# Patient Record
Sex: Female | Born: 1975 | Race: Asian | Hispanic: No | Marital: Single
Health system: Southern US, Community
[De-identification: ages and names within clinical notes are randomized; demographics above are authoritative.]

---

## 2003-12-21 ENCOUNTER — Ambulatory Visit (HOSPITAL_COMMUNITY): Admission: RE | Admit: 2003-12-21 | Discharge: 2003-12-21 | Payer: Self-pay | Admitting: Family Medicine

## 2016-09-09 ENCOUNTER — Other Ambulatory Visit: Payer: Self-pay | Admitting: Family

## 2016-09-09 DIAGNOSIS — Z1231 Encounter for screening mammogram for malignant neoplasm of breast: Secondary | ICD-10-CM

## 2016-09-25 ENCOUNTER — Ambulatory Visit
Admission: RE | Admit: 2016-09-25 | Discharge: 2016-09-25 | Disposition: A | Discharge status: Home / Self Care | Payer: No Typology Code available for payment source | Source: Ambulatory Visit | Attending: Family | Admitting: Family

## 2016-09-25 DIAGNOSIS — Z1231 Encounter for screening mammogram for malignant neoplasm of breast: Secondary | ICD-10-CM

## 2017-12-03 ENCOUNTER — Ambulatory Visit: Payer: BLUE CROSS/BLUE SHIELD | Admitting: Neurology

## 2018-05-22 IMAGING — MG DIGITAL SCREENING BILATERAL MAMMOGRAM WITH CAD
4 series · 4 of 4 positions shown · non-contrast
Comparison: None.

CLINICAL DATA: Screening.

EXAM:
DIGITAL SCREENING BILATERAL MAMMOGRAM WITH CAD

[L CC]
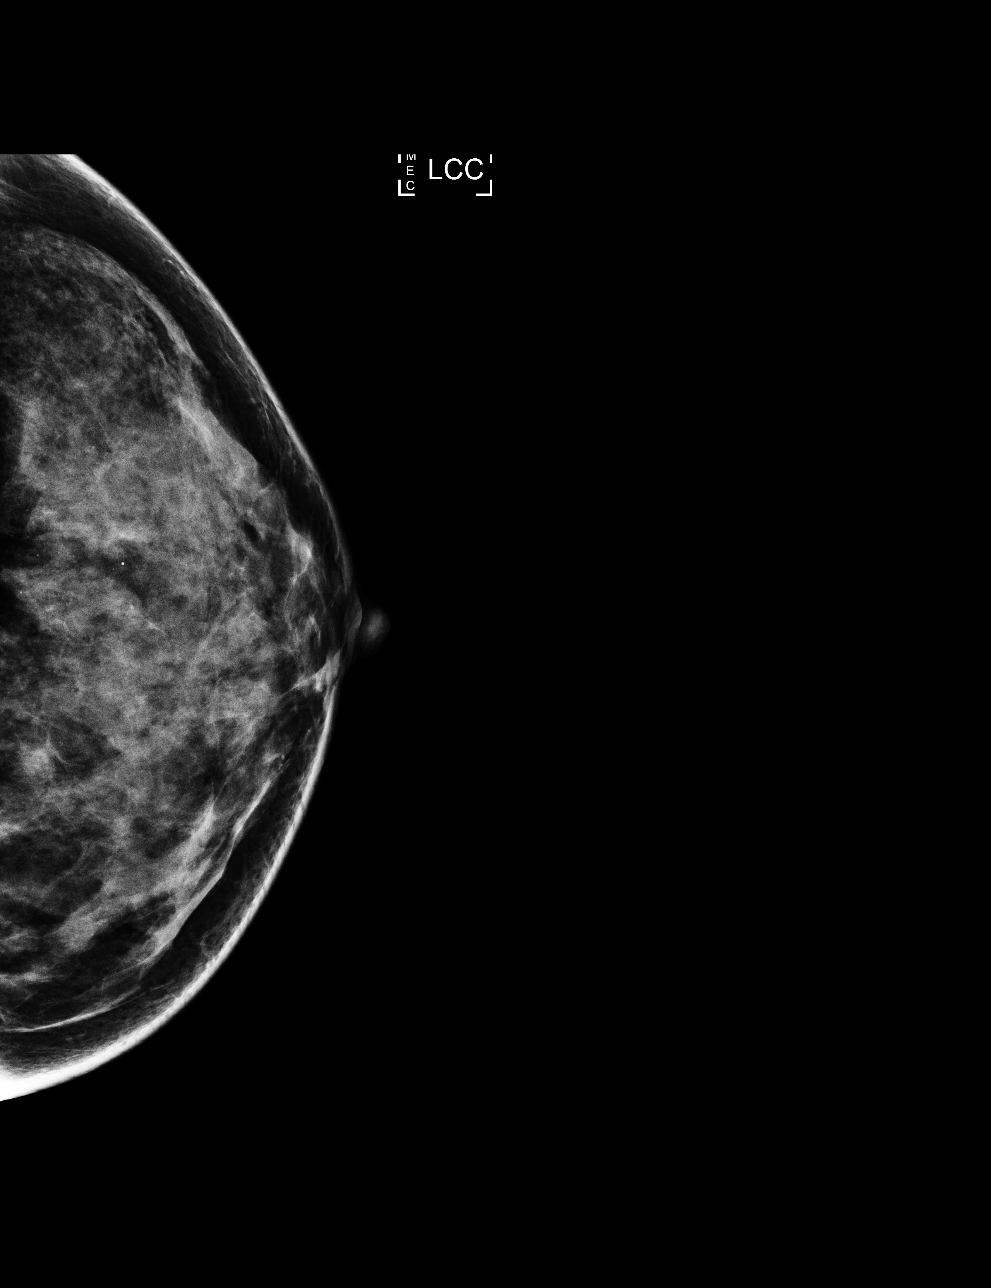

[L MLO]
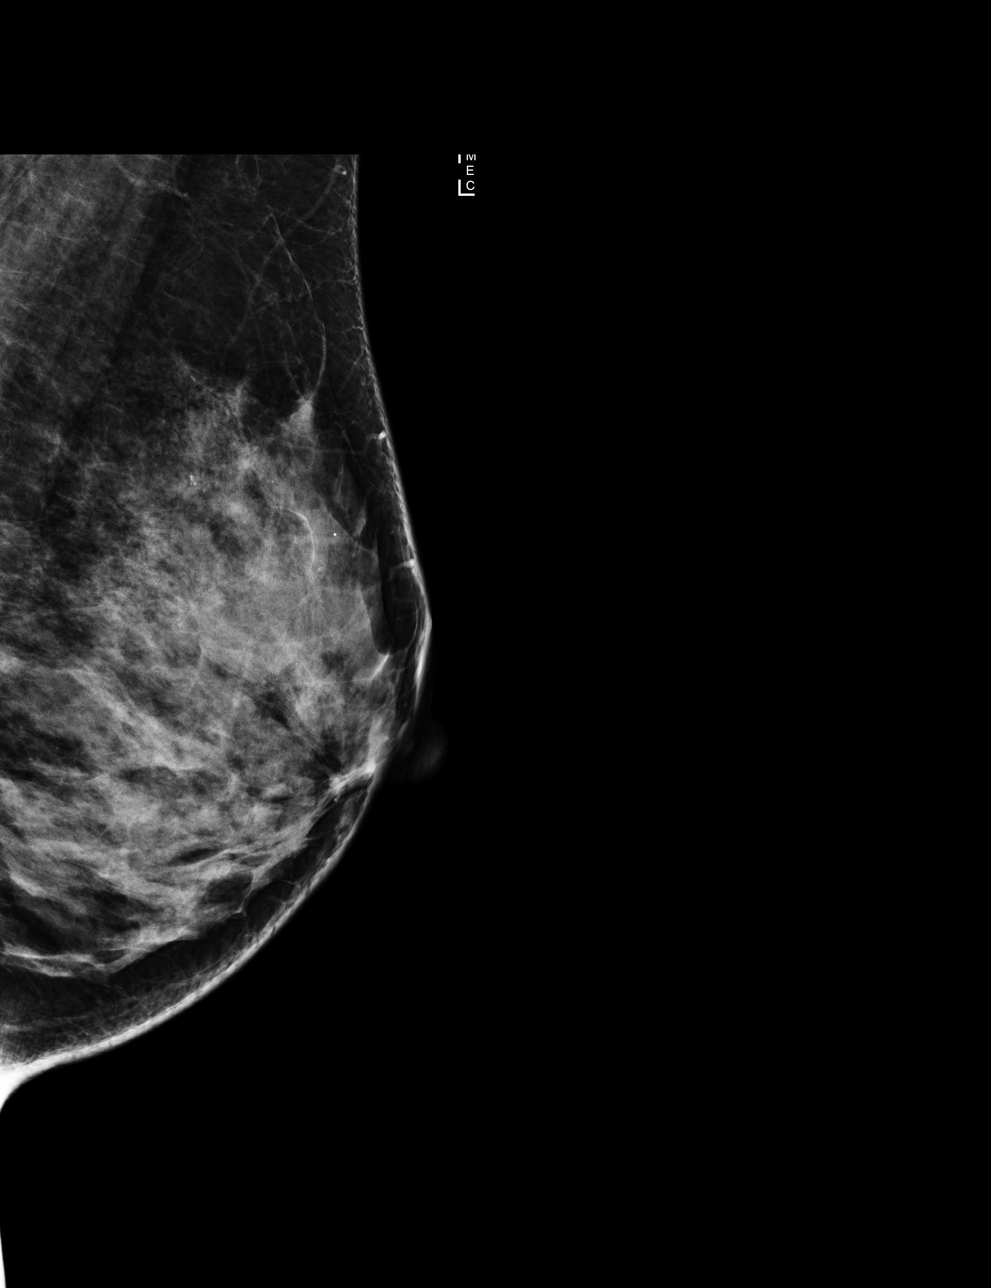

[R CC]
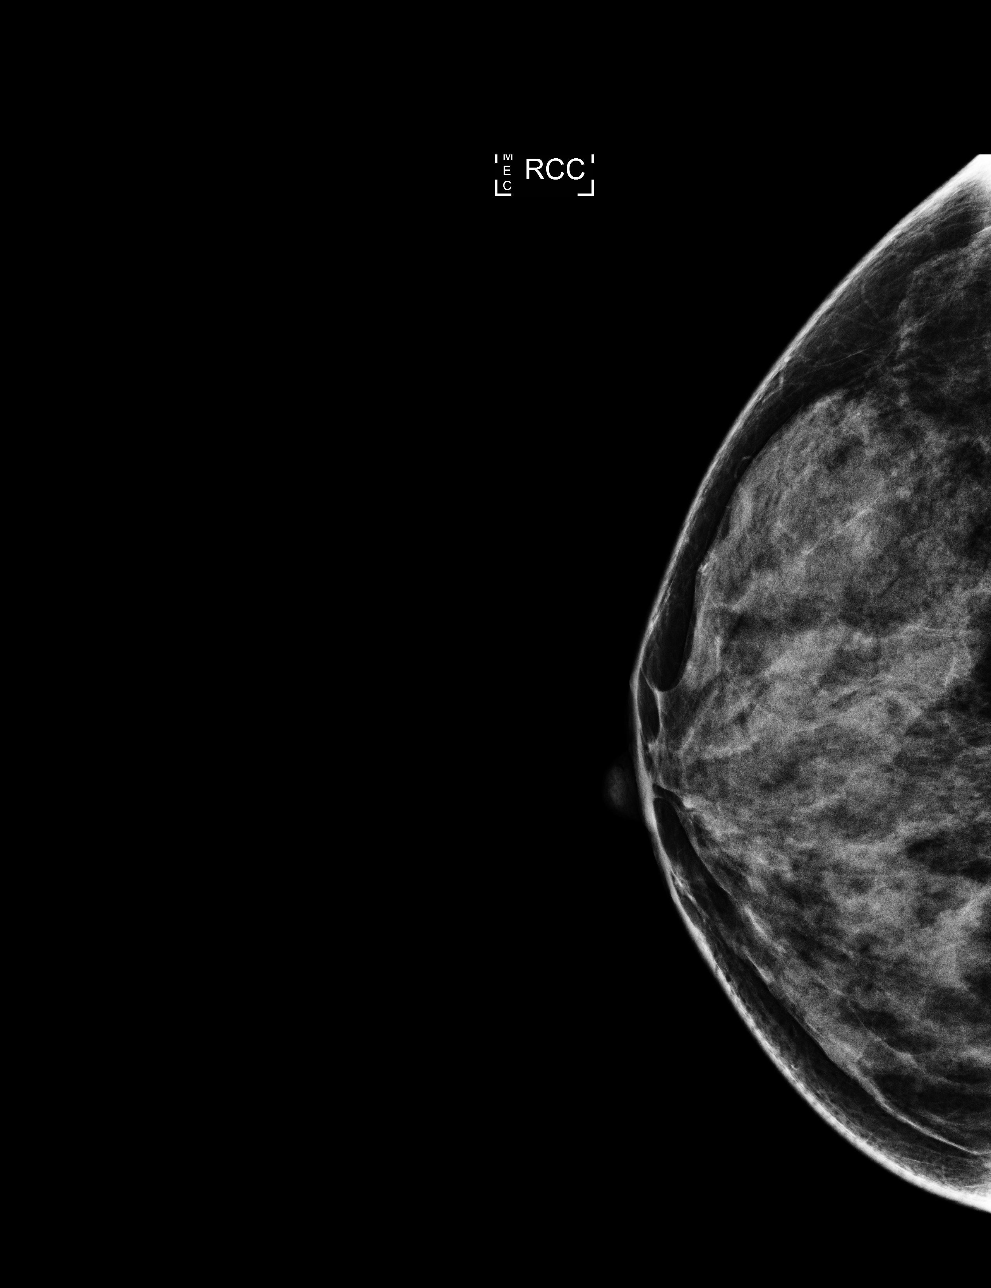

[R MLO]
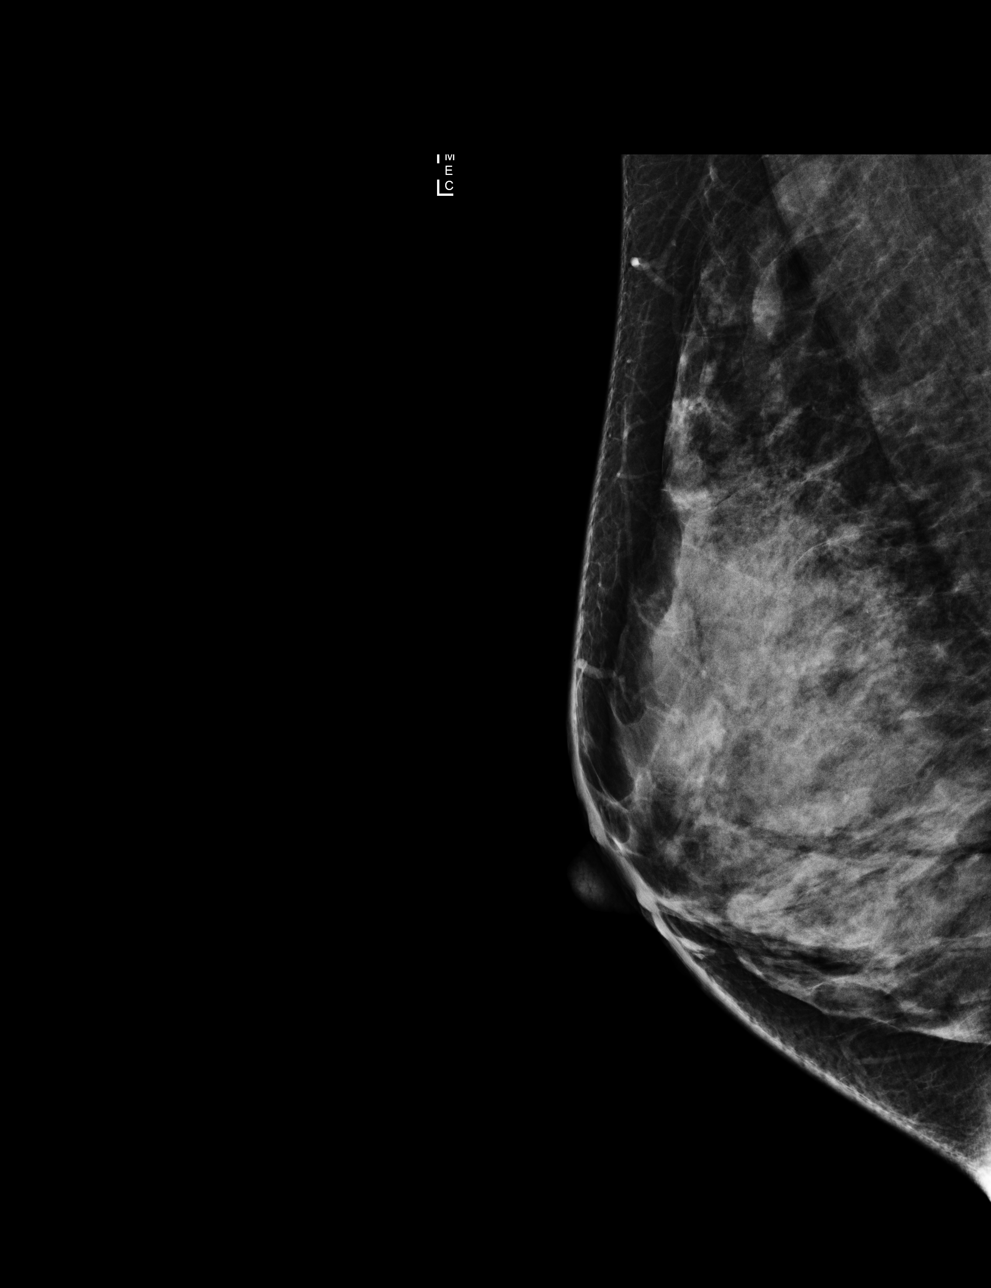

[4 of 4 positions shown; findings below may reference images not displayed]

ACR Breast Density Category c: The breast tissue is heterogeneously
dense, which may obscure small masses
FINDINGS: There are no findings suspicious for malignancy. Images were
processed with CAD.
IMPRESSION: No mammographic evidence of malignancy. A result letter of this
screening mammogram will be mailed directly to the patient.

RECOMMENDATION:
Screening mammogram in one year. (Code:U2-0-761)

BI-RADS CATEGORY  1: Negative.

## 2019-04-15 ENCOUNTER — Ambulatory Visit: Payer: No Typology Code available for payment source | Attending: Family

## 2019-04-15 DIAGNOSIS — Z23 Encounter for immunization: Secondary | ICD-10-CM

## 2019-04-15 NOTE — Progress Notes (Signed)
   [facility] Vacc[facility]  Name:  Erin Cummings    MRN: [id number] DOB: [birth year]  04/15/2019  [name] was observed post Covid-19 immunization for 15 minutes without incident. She was provided with Vaccine Information Sheet and instruction to access the V-Safe system.   [name] was instructed to call 911 with any severe reactions post vaccine: Marland Kitchen Difficulty breathing  . Swelling of face and throat  . A fast heartbeat  . A bad rash all over body  . Dizziness and weakness   Immunizations Administered    Name Date Dose VIS Date Route   Moderna COVID-19 Vaccine 04/15/2019 12:45 PM 0.5 mL 12/15/2018 Intramuscular   Manufacturer: Moderna   Lot: [id number]   NDC: 85631-497-02

## 2019-05-18 ENCOUNTER — Ambulatory Visit: Payer: No Typology Code available for payment source | Attending: Internal Medicine

## 2020-10-11 ENCOUNTER — Encounter: Payer: Self-pay | Admitting: *Deleted

## 2023-08-14 ENCOUNTER — Other Ambulatory Visit: Payer: Self-pay | Admitting: Medical Genetics

## 2023-11-19 ENCOUNTER — Other Ambulatory Visit: Payer: Self-pay | Admitting: Medical Genetics

## 2023-11-19 DIAGNOSIS — Z006 Encounter for examination for normal comparison and control in clinical research program: Secondary | ICD-10-CM
